# Patient Record
Sex: Female | Born: 1950 | Race: White | Hispanic: No | Smoking: Former smoker
Health system: Southern US, Community
[De-identification: ages and names within clinical notes are randomized; demographics above are authoritative.]

## PROBLEM LIST (undated history)

## (undated) DIAGNOSIS — E739 Lactose intolerance, unspecified: Secondary | ICD-10-CM

## (undated) DIAGNOSIS — L900 Lichen sclerosus et atrophicus: Secondary | ICD-10-CM

## (undated) DIAGNOSIS — F419 Anxiety disorder, unspecified: Secondary | ICD-10-CM

## (undated) DIAGNOSIS — T4145XA Adverse effect of unspecified anesthetic, initial encounter: Secondary | ICD-10-CM

## (undated) DIAGNOSIS — K759 Inflammatory liver disease, unspecified: Secondary | ICD-10-CM

## (undated) DIAGNOSIS — M5136 Other intervertebral disc degeneration, lumbar region: Secondary | ICD-10-CM

## (undated) DIAGNOSIS — E119 Type 2 diabetes mellitus without complications: Secondary | ICD-10-CM

## (undated) DIAGNOSIS — M51369 Other intervertebral disc degeneration, lumbar region without mention of lumbar back pain or lower extremity pain: Secondary | ICD-10-CM

## (undated) DIAGNOSIS — F32A Depression, unspecified: Secondary | ICD-10-CM

## (undated) DIAGNOSIS — F329 Major depressive disorder, single episode, unspecified: Secondary | ICD-10-CM

## (undated) DIAGNOSIS — M5030 Other cervical disc degeneration, unspecified cervical region: Secondary | ICD-10-CM

## (undated) DIAGNOSIS — T8859XA Other complications of anesthesia, initial encounter: Secondary | ICD-10-CM

## (undated) DIAGNOSIS — R569 Unspecified convulsions: Secondary | ICD-10-CM

## (undated) HISTORY — DX: Type 2 diabetes mellitus without complications: E11.9

## (undated) HISTORY — PX: CHOLECYSTECTOMY: SHX55

## (undated) HISTORY — PX: OTHER SURGICAL HISTORY: SHX169

## (undated) HISTORY — DX: Other intervertebral disc degeneration, lumbar region without mention of lumbar back pain or lower extremity pain: M51.369

## (undated) HISTORY — DX: Lichen sclerosus et atrophicus: L90.0

## (undated) HISTORY — PX: SPINE SURGERY: SHX786

## (undated) HISTORY — PX: APPENDECTOMY: SHX54

## (undated) HISTORY — PX: TUBAL LIGATION: SHX77

## (undated) HISTORY — DX: Other intervertebral disc degeneration, lumbar region: M51.36

## (undated) HISTORY — DX: Lactose intolerance, unspecified: E73.9

## (undated) HISTORY — DX: Other cervical disc degeneration, unspecified cervical region: M50.30

---

## 1997-07-12 ENCOUNTER — Other Ambulatory Visit: Admission: RE | Admit: 1997-07-12 | Discharge: 1997-07-12 | Payer: Self-pay | Admitting: *Deleted

## 1999-06-09 ENCOUNTER — Other Ambulatory Visit: Admission: RE | Admit: 1999-06-09 | Discharge: 1999-06-09 | Payer: Self-pay | Admitting: *Deleted

## 2000-07-28 ENCOUNTER — Other Ambulatory Visit: Admission: RE | Admit: 2000-07-28 | Discharge: 2000-07-28 | Payer: Self-pay | Admitting: *Deleted

## 2001-07-25 ENCOUNTER — Other Ambulatory Visit: Admission: RE | Admit: 2001-07-25 | Discharge: 2001-07-25 | Payer: Self-pay | Admitting: *Deleted

## 2002-08-07 ENCOUNTER — Other Ambulatory Visit: Admission: RE | Admit: 2002-08-07 | Discharge: 2002-08-07 | Payer: Self-pay | Admitting: *Deleted

## 2003-06-24 ENCOUNTER — Other Ambulatory Visit: Admission: RE | Admit: 2003-06-24 | Discharge: 2003-06-24 | Payer: Self-pay | Admitting: *Deleted

## 2004-08-17 ENCOUNTER — Other Ambulatory Visit: Admission: RE | Admit: 2004-08-17 | Discharge: 2004-08-17 | Payer: Self-pay | Admitting: *Deleted

## 2005-04-30 ENCOUNTER — Ambulatory Visit: Payer: Self-pay | Admitting: Oncology

## 2005-05-10 ENCOUNTER — Ambulatory Visit: Payer: Self-pay | Admitting: Oncology

## 2007-03-10 ENCOUNTER — Emergency Department (HOSPITAL_COMMUNITY): Admission: EM | Admit: 2007-03-10 | Discharge: 2007-03-10 | Payer: Self-pay | Admitting: Emergency Medicine

## 2007-03-12 ENCOUNTER — Emergency Department (HOSPITAL_COMMUNITY): Admission: EM | Admit: 2007-03-12 | Discharge: 2007-03-12 | Payer: Self-pay | Admitting: Family Medicine

## 2007-05-23 ENCOUNTER — Emergency Department (HOSPITAL_COMMUNITY): Admission: EM | Admit: 2007-05-23 | Discharge: 2007-05-23 | Payer: Self-pay | Admitting: Emergency Medicine

## 2007-09-15 ENCOUNTER — Other Ambulatory Visit: Admission: RE | Admit: 2007-09-15 | Discharge: 2007-09-15 | Payer: Self-pay | Admitting: Gynecology

## 2008-07-09 ENCOUNTER — Ambulatory Visit: Payer: Self-pay | Admitting: Internal Medicine

## 2008-07-09 DIAGNOSIS — Z91018 Allergy to other foods: Secondary | ICD-10-CM

## 2008-07-09 LAB — CONVERTED CEMR LAB
Basophils Relative: 0.1 % (ref 0.0–3.0)
Eosinophils Absolute: 0.1 10*3/uL (ref 0.0–0.7)
Eosinophils Relative: 0.9 % (ref 0.0–5.0)
HCT: 40.8 % (ref 36.0–46.0)
Hemoglobin: 14.5 g/dL (ref 12.0–15.0)
MCHC: 35.7 g/dL (ref 30.0–36.0)
MCV: 94.2 fL (ref 78.0–100.0)
Monocytes Absolute: 0.8 10*3/uL (ref 0.1–1.0)
Neutro Abs: 12.3 10*3/uL — ABNORMAL HIGH (ref 1.4–7.7)
Neutrophils Relative %: 76.7 % (ref 43.0–77.0)
RBC: 4.33 M/uL (ref 3.87–5.11)
WBC: 15.9 10*3/uL — ABNORMAL HIGH (ref 4.5–10.5)

## 2008-07-13 DIAGNOSIS — J309 Allergic rhinitis, unspecified: Secondary | ICD-10-CM | POA: Insufficient documentation

## 2008-07-13 DIAGNOSIS — E119 Type 2 diabetes mellitus without complications: Secondary | ICD-10-CM

## 2008-08-15 ENCOUNTER — Ambulatory Visit: Payer: Self-pay | Admitting: Internal Medicine

## 2008-08-19 ENCOUNTER — Telehealth (INDEPENDENT_AMBULATORY_CARE_PROVIDER_SITE_OTHER): Payer: Self-pay | Admitting: *Deleted

## 2008-12-16 ENCOUNTER — Ambulatory Visit: Payer: Self-pay | Admitting: Internal Medicine

## 2008-12-16 DIAGNOSIS — F17200 Nicotine dependence, unspecified, uncomplicated: Secondary | ICD-10-CM

## 2009-06-19 ENCOUNTER — Ambulatory Visit: Payer: Self-pay | Admitting: Internal Medicine

## 2009-06-19 ENCOUNTER — Telehealth: Payer: Self-pay | Admitting: Internal Medicine

## 2009-08-13 ENCOUNTER — Telehealth: Payer: Self-pay | Admitting: Internal Medicine

## 2009-08-13 ENCOUNTER — Encounter: Payer: Self-pay | Admitting: Internal Medicine

## 2009-08-15 ENCOUNTER — Ambulatory Visit: Payer: Self-pay | Admitting: Internal Medicine

## 2009-08-19 ENCOUNTER — Telehealth (INDEPENDENT_AMBULATORY_CARE_PROVIDER_SITE_OTHER): Payer: Self-pay | Admitting: *Deleted

## 2009-11-07 ENCOUNTER — Ambulatory Visit: Payer: Self-pay | Admitting: Internal Medicine

## 2009-12-03 ENCOUNTER — Telehealth (INDEPENDENT_AMBULATORY_CARE_PROVIDER_SITE_OTHER): Payer: Self-pay | Admitting: *Deleted

## 2010-02-17 NOTE — Progress Notes (Signed)
Summary: letter request/ DDS  Phone Note Call from Patient Call back at Home Phone [phone]   Caller: Patient Call For: Esa Raden Summary of Call: pt requests a letter re: her allergies. this is for DSS purposes- to increase her food/ ebit allowance. i have verified pt's phone # [phone] as well as her home address.  Initial call taken by: Tivis Ringer, CNA,  June 19, 2009 3:03 PM  Follow-up for Phone Call        letter dictated

## 2010-02-17 NOTE — Progress Notes (Signed)
Summary: ALLERGY  Phone Note Call from Patient   Caller: Patient Call For: Dr.Young Details for Reason: Get shots at another office. Summary of Call: Mrs. Rathje called today,she can't afford the gas to come here twice a week. She wants to know if she can get her shots at the [facility] office in [place].( two blocks from her house) Please call her and let her know your decision.[phone]) Initial call taken by: Dimas Millin,  August 19, 2009 3:38 PM  Follow-up for Phone Call        If she can find an office that is willing to give her the allergy shots and assume responsbility then its okay with CDY-just needs documentation from that office.Reynaldo Minium CMA  August 19, 2009 4:52 PM   Additional Follow-up for Phone Call Additional follow up Details #1::        I called Mrs.Arciniega back she said she would call around and let us know what she finds out.  Mrs. Khalid called back yesterday. She has an appt. wk. after next with her primary Dr. then he will refer her to an allergist in [place]. Hopefully they will allow her to get our 1:50000 at their office. Additional Follow-up by: Dimas Millin,  August 19, 2009 4:58 PM

## 2010-02-17 NOTE — Assessment & Plan Note (Signed)
Summary: Wendy House ///KP   Primary Provider/Referring Provider:  Clyda Greener  CC:  Rov and pt states she has been feeling well and doing great!.  History of Present Illness:   December 16, 2008- Allergic rhinitis, ? 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 She says is allergic to aluminum because she got a rash from Secret Deodorant years ago. Now uses unscented. Admits cough sometimes at night. Still smokes. Declines flu vax, saying nutiriton has prevented colds for 20 years. Asks about Pneumovax- discussed.  June 19, 2009- Allergic rhinitis, tobacco, ? food allergy/ intolerance Adjusts her alternative medical therapies- papaya, etc- as needed. Eating a gluten-free vegetarian diet. Trying to cut down smoking, says about 5 cigarettes in past week. Spring pollen season- need Astepro twice daily and uses nasal saline gel. Chest feels tighter/ smothered when outside in humid weather.  Irritant odors make eyes water, throat swell.  Dry cough, rare phlegm. Notes some dyspnea walking outside in morning air. She asks about help getting an air purifier      Preventive Screening-Counseling & Management  Alcohol-Tobacco     Smoking Status: current     Packs/Day: <0.25     Tobacco Counseling: to quit use of tobacco products  Current Medications (verified): 1)  Vistaril 25 Mg Caps (Hydroxyzine Pamoate) .Marland Kitchen.. 1 Three Times A Day 2)  Divalproex Sodium 500 Mg Tbec (Divalproex Sodium) .... Take 1 By Mouth At Bedtime 3)  Super Papaya Plus  Chew (Digestive Enzymes) .... As Needed 4)  Back Ache With Arnica .... As Needed 5)  Astepro 0.15 % Soln (Azelastine Hcl) .Marland Kitchen.. 1-2 Sprays Each Nostril Up To Two Times A Day As Needed 6)  Flexeril .... Take 1 Tablet By Mouth Three Times A Day As Needed (Pt Unsure of Strength) 7)  Ayr Saline Nasal  Gel (Saline) .... As Needed  Allergies (verified): 1)  ! Asa 2)  ! Prednisone 3)  ! Sudafed 4)  ! * Barley 5)   ! Aluminum (Aluminum)  Past History:  Past Medical History: Last updated: 12/16/2008 Diabetes, Type 2 Allergic Rhinitis- Skin test- 08/15/08 lactose intolerance ? food intolerance Degenerative disk disease- cervical and lumbar Vaginal Lichen Slerosus  Family History: Last updated: 08/15/2008 Seasonal allergies arthritis Cander- colon  Social History: Last updated: 06/19/2009 Divorced Patient is a current smoker. 1-2 ppd Disabled by back pain Lives in apartment  Risk Factors: Smoking Status: current (06/19/2009) Packs/Day: <0.25 (06/19/2009)  Past Surgical History: Cholecystectomy Spine surgery Vaginal polypectomy  Social History: Divorced Patient is a current smoker. 1-2 ppd Disabled by back pain Lives in apartment Packs/Day:  <0.25  Review of Systems      See HPI       The patient complains of non-productive cough.  The patient denies shortness of breath with activity, shortness of breath at rest, productive cough, coughing up blood, chest pain, irregular heartbeats, acid heartburn, indigestion, loss of appetite, weight change, abdominal pain, difficulty swallowing, sore throat, tooth/dental problems, headaches, nasal congestion/difficulty breathing through nose, and sneezing.    Vital Signs:  Patient profile:   60 year old female Height:      60 inches Weight:      120 pounds BMI:     23.52 O2 Sat:      97 % on Room air Pulse rate:   96 / minute BP sitting:   130 / 78  (right arm) Cuff size:   regular  Vitals Entered By:  Denna Haggard, CMA (June 19, 2009 1:41 PM)  O2 Sat at Rest %:  97% O2 Flow:  Room air CC: Rov, pt states she has been feeling well and doing great!   Physical Exam  Additional Exam:  General: A/Ox3; pleasant and cooperative, NAD, WDWN, odor of tobacco SKIN: no rash, lesions NODES: no lymphadenopathy HEENT: [place]/AT, EOM- WNL, Conjuctivae- clear, PERRLA, TM-WNL, Nose- clear, Throat- clear, Mallampati  III NECK: Supple w/ fair  ROM, JVD- none, normal carotid impulses w/o bruits Thyroid- CHEST: Clear to P&A HEART: RRR, no m/g/r heard ABDOMEN: Soft and nl;  OZH:YQMV, nl pulses, no edema, left wrist brace "carpal tunnel" NEURO: Grossly intact to observation      Impression & Recommendations:  Problem # 1:  PERSONAL HISTORY OF ALLERGY TO OTHER FOODS (ICD-V15.05) Has to avoid foods she can't tolerate. She maintains an atypical diet.  Problem # 2:  ALLERGIC RHINITIS (ICD-477.9)  Continues Astepro and nasal saline gel She wants to try allergy vaccine which we discussed very carefully. I explained anaphyllaxis and we discussed lack of direct vaccine for foods. She will talk to the billing coordinator. Her updated medication list for this problem includes:    Astepro 0.15 % Soln (Azelastine hcl) .Marland Kitchen... 1-2 sprays each nostril up to two times a day as needed    Ayr Saline Nasal Gel (Saline) .Marland Kitchen... As needed  Problem # 3:  TOBACCO USER (ICD-305.1)  We discussed smoking cessation techniques and motivation to get off completely.  Medications Added to Medication List This Visit: 1)  Ayr Saline Nasal Gel (Saline) .... As needed  Other Orders: Est. Patient Level III (78469) Tobacco use cessation intermediate 3-10 minutes (62952)  Patient Instructions: 1)  Please schedule a follow-up appointment in 1 year. 2)  Talk with the billing clerk to see what allergy shots would cost. If you decide you want to start allergy shots, then please let us know and we will have the allergy lab call you. 3)  Note given for DDS explaining you limit your diet choices due to food intolerance.

## 2010-02-17 NOTE — Progress Notes (Signed)
Summary: Ms. Siers wants allergy shots  Phone Note Call from Patient   Caller: Patient Call For: Dr. Maple Hudson Summary of Call: Patient called and questioned some information about taking alergy shots. I answered questions and patient said to advise Dr. Maple Hudson that she would like to start allergy shots.I advised her I would get the message to Dr. Maple Hudson. Initial call taken by: Clarise Cruz Duncan Dull),  August 13, 2009 9:18 AM    New/Updated Medications: * ALLERGY VACCINE GH NEW START

## 2010-02-17 NOTE — Progress Notes (Signed)
Summary: allergy shots  Phone Note Call from Patient   Caller: [phone] Call For: young Reason for Call: Talk to Nurse Summary of Call: Patient has question about allergy shots? Initial call taken by: Lehman Prom,  December 03, 2009 12:55 PM  Follow-up for Phone Call        will snd to tammy scott to address per cy's request.   Philipp Deputy [facility]  December 03, 2009 1:36 PM   Additional Follow-up for Phone Call Additional follow up Details #1::        I answered pt.'s question. Now she wants [place] to send her allergy records to Dr.Zozlow in [place]. Additional Follow-up by: Dimas Millin,  December 03, 2009 4:21 PM    Additional Follow-up for Phone Call Additional follow up Details #2::    I spoke with patient-she is aware she should have signed a release form at the [place] office for [place] to send our records for cont of care-they will be her allergy dr now for tx of allergies.Reynaldo Minium CMA  December 03, 2009 4:30 PM

## 2010-02-17 NOTE — Assessment & Plan Note (Signed)
Summary: rov//mbw   Primary Provider/Referring Provider:  [facility]  CC:  Follow up visit-allergies.  History of Present Illness: December 16, 2008- Allergic rhinitis, ? 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 She says is allergic to aluminum because she got a rash from Secret Deodorant years ago. Now uses unscented. Admits cough sometimes at night. Still smokes. Declines flu vax, saying nutiriton has prevented colds for 20 years. Asks about Pneumovax- discussed.  June 19, 2009- Allergic rhinitis, tobacco, ? food allergy/ intolerance Adjusts her alternative medical therapies- papaya, etc- as needed. Eating a gluten-free vegetarian diet. Trying to cut down smoking, says about 5 cigarettes in past week. Spring pollen season- need Astepro twice daily and uses nasal saline gel. Chest feels tighter/ smothered when outside in humid weather.  Irritant odors make eyes water, throat swell.  Dry cough, rare phlegm. Notes some dyspnea walking outside in morning air. She asks about help getting an air purifier  November 07, 2009-  Allergic rhinitis, tobacco,  food allergy/ intolerance NurseCC: Follow up visit-allergies She says she moved back to [place] because [place] is too damp and cold, making her ache, and she doesn't hurt in [place].  Dr Kathyrn Lass group said they would be able to gve her allergy shots if we started here. . I suggested she would do better to let us send records there so they can do all treatment for her allergy problems in [place] based on their own assessment and recomendation., which would be easier for her  She tries adding back a food every three months and finds that she can eat regular yogurt with fruit in the bottom, but not yogurt with honey.  Still smoking 1 carton/ month. "Can't afford it", but hasn't stopped. We discussed smokng cessation support again today.  Little blowing, sneezing, coughing or  wheeze.     Preventive Screening-Counseling & Management  Alcohol-Tobacco     Smoking Status: current     Packs/Day: <0.25     Tobacco Counseling: to quit use of tobacco products  Comments: Reinbforced recommendation  Current Medications (verified): 1)  Divalproex Sodium 500 Mg Tbec (Divalproex Sodium) .... Take 1 By Mouth At Bedtime 2)  Super Papaya Plus  Chew (Digestive Enzymes) .... As Needed 3)  Back Ache With Arnica .... As Needed 4)  Astepro 0.15 % Soln (Azelastine Hcl) .Marland Kitchen.. 1-2 Sprays Each Nostril Up To Two Times A Day As Needed 5)  Flexeril .... Take 1 Tablet By Mouth Three Times A Day As Needed (Pt Unsure of Strength) 6)  Ayr Saline Nasal  Gel (Saline) .... As Needed 7)  Allergy Vaccine Gh New Start 8)  Tramadol Hcl 50 Mg Tabs (Tramadol Hcl) .... Take 1 By Mouth Three Times A Day As Needed 9)  Naproxen 500 Mg Tabs (Naproxen) .... Take 1 By Mouth Two Times A Day As Needed 10)  Amitriptyline Hcl 25 Mg Tabs (Amitriptyline Hcl) .... Take 1 By Mouth At Bedtime  Allergies (verified): 1)  ! Asa 2)  ! Prednisone 3)  ! Sudafed 4)  ! * Barley 5)  ! Aluminum (Aluminum)  Past History:  Past Medical History: Last updated: 12/16/2008 Diabetes, Type 2 Allergic Rhinitis- Skin test- 08/15/08 lactose intolerance ? 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 1-2 ppd Disabled by back pain Lives in apartment  Risk Factors: Smoking Status: current (11/07/2009) Packs/Day: <0.25 (11/07/2009)  Review of Systems      See HPI       The patient complains of shortness of breath with activity and nasal congestion/difficulty breathing through nose.  The patient denies  shortness of breath at rest, productive cough, non-productive cough, coughing up blood, chest pain, irregular heartbeats, acid heartburn, indigestion, loss of appetite, weight change, abdominal pain, difficulty swallowing, sore throat, tooth/dental problems, headaches, and sneezing.    Vital Signs:  Patient profile:   60 year old female Height:      60 inches Weight:      121.13 pounds BMI:     23.74 O2 Sat:      98 % on Room air Pulse rate:   95 / minute BP sitting:   116 / 76  (left arm) Cuff size:   regular  Vitals Entered By: Reynaldo Minium CMA (November 07, 2009 3:28 PM)  O2 Flow:  Room air CC: Follow up visit-allergies   Physical Exam  Additional Exam:  General: A/Ox3; pleasant and cooperative, NAD, WDWN,  SKIN: no rash, lesions NODES: no lymphadenopathy HEENT: [place]/AT, EOM- WNL, Conjuctivae- clear, PERRLA, TM-WNL, Nose- clear, Throat- clear, Mallampati  III NECK: Supple w/ fair ROM, JVD- none, normal carotid impulses w/o bruits Thyroid- CHEST: Clear to P&A, no cough or wheeze HEART: RRR, no m/g/r heard ABDOMEN: Soft and nl;  ZOX:WRUE, nl pulses, no edema, left wrist brace "carpal tunnel" NEURO: Grossly intact to observation      Impression & Recommendations:  Problem # 1:  PERSONAL HISTORY OF ALLERGY TO OTHER FOODS (ICD-V15.05) Dr Elnoria Howard did not tell her she had Celiac Disease. She emphasizes her [place] background as a risk factor.  I suggested she continue trying to garadually add back foods as able and keep a food diary. I again explained food intolerance as a better term for her than food allergy  Problem # 2:  ALLERGIC RHINITIS (ICD-477.9)  I am not convinced at all that she needs allergy vaccine. I would prefer that she watch symptoms associated with her new home in [place]. If symptomatic otc meds are insufficient, then it would be much easier on her to get allergy care locally in [place]. i will see her here if she wishes, but that is my recommendation.  Her  updated medication list for this problem includes:    Astepro 0.15 % Soln (Azelastine hcl) .Marland Kitchen... 1-2 sprays each nostril up to two times a day as needed    Ayr Saline Nasal Gel (Saline) .Marland Kitchen... As needed  Problem # 3:  TOBACCO USER (ICD-305.1)  I again emphasized smoking cessation.   Medications Added to Medication List This Visit: 1)  Tramadol Hcl 50 Mg Tabs (Tramadol hcl) .... Take 1 by mouth three times a day as needed 2)  Naproxen 500 Mg Tabs (Naproxen) .... Take 1 by mouth two times a day as needed 3)  Amitriptyline Hcl 25 Mg Tabs (Amitriptyline hcl) .... Take 1 by mouth at bedtime  Other Orders: Est. Patient Level III (45409)  Patient Instructions: 1)  Please schedule a follow-up appointment as needed. 2)  I suggest that it makes more sense for you to work with the doctors in [place] for your allergy and food intolerance problems. Please call them for an appointment and they will contact us for release of records. I will be happy to see  you here in the future if needed.

## 2010-02-17 NOTE — Miscellaneous (Signed)
Summary: New Vaccine/[facility]  New Vaccine/[facility]   Imported By: Sherian Rein 09/18/2009 08:38:05  _____________________________________________________________________  External Attachment:    Type:   Image     Comment:   External Document

## 2010-02-18 ENCOUNTER — Encounter: Payer: Self-pay | Admitting: Cardiovascular Disease

## 2010-02-19 ENCOUNTER — Encounter (INDEPENDENT_AMBULATORY_CARE_PROVIDER_SITE_OTHER): Payer: Medicaid Other

## 2010-02-19 ENCOUNTER — Encounter: Payer: Self-pay | Admitting: Cardiovascular Disease

## 2010-02-19 ENCOUNTER — Ambulatory Visit: Admit: 2010-02-19 | Payer: Self-pay

## 2010-02-19 DIAGNOSIS — I739 Peripheral vascular disease, unspecified: Secondary | ICD-10-CM

## 2010-02-19 DIAGNOSIS — E1159 Type 2 diabetes mellitus with other circulatory complications: Secondary | ICD-10-CM

## 2010-02-25 NOTE — Miscellaneous (Signed)
Summary: Orders Update  Clinical Lists Changes  Orders: Added new Test order of Arterial Duplex Lower Extremity (Arterial Duplex Low) - Signed 

## 2010-06-05 NOTE — Letter (Signed)
July 07, 2009    Shaelee Forni  [address]  [place], [place] [address]   RE:  Cyanne, Delmar [facility]  MRN:  [id number]  /  DOB:  1950/07/10   To whom it may concern,   Ms. Beechy has been seen by me for allergy evaluation with concern of  food allergy.  She is on a self-managed diet, utilizing avoidance of  problem foods, food supplements, avoidance of gluten.  On this regimen,  she feels her symptoms are better controlled.  Unfortunately, this diet  represents additional cost for her and is a hardship.  She asks your  support in assisting her with the cost of her diet in the hope that her  food allowance can be increased.    Sincerely,      Clinton D. Maple Hudson, MD, Tonny Bollman, FACP  Electronically Signed    CDY/MedQ  DD: 07/07/2009  DT: 07/07/2009  Job #: [id number]

## 2010-06-10 ENCOUNTER — Encounter: Payer: Self-pay | Admitting: Internal Medicine

## 2010-06-18 ENCOUNTER — Ambulatory Visit: Payer: Self-pay | Admitting: Internal Medicine

## 2010-06-25 ENCOUNTER — Ambulatory Visit: Payer: Self-pay | Admitting: Internal Medicine

## 2010-07-21 ENCOUNTER — Ambulatory Visit: Payer: Self-pay | Admitting: Internal Medicine

## 2010-10-12 LAB — URINALYSIS, ROUTINE W REFLEX MICROSCOPIC
Glucose, UA: NEGATIVE
Ketones, ur: 15 — AB
Leukocytes, UA: NEGATIVE
pH: 6

## 2010-10-12 LAB — URINE MICROSCOPIC-ADD ON

## 2010-11-30 ENCOUNTER — Other Ambulatory Visit: Payer: Self-pay | Admitting: Neurological Surgery

## 2010-12-15 ENCOUNTER — Inpatient Hospital Stay (HOSPITAL_COMMUNITY): Admission: RE | Admit: 2010-12-15 | Discharge: 2010-12-15 | Payer: Medicaid Other | Source: Ambulatory Visit

## 2010-12-15 NOTE — Pre-Procedure Instructions (Signed)
20 Wendy House  12/15/2010   Your procedure is scheduled on: Dec 21 2010    Report to [facility] at 0730 AM.  Call this number if you have problems the morning of surgery: [phone]   Remember:   Do not eat food:After Midnight.  May have clear liquids: up to 4 Hours before arrival.  Clear liquids include soda, tea, black coffee, apple or grape juice, broth.  Take these medicines the morning of surgery with A SIP OF WATER: *NONE**   Do not wear jewelry, make-up or nail polish.  Do not wear lotions, powders, or perfumes. You may wear deodorant.  Do not shave 48 hours prior to surgery.  Do not bring valuables to the hospital.  Contacts, dentures or bridgework may not be worn into surgery.  Leave suitcase in the car. After surgery it may be brought to your room.  For patients admitted to the hospital, checkout time is 11:00 AM the day of discharge.   Patients discharged the day of surgery will not be allowed to drive home.  Name and phone number of your driver:FAMILY  Special Instructions: Incentive Spirometry - Practice and bring it with you on the day of surgery. and CHG Shower Use Special Wash: 1/2 bottle night before surgery and 1/2 bottle morning of surgery.   Please read over the following fact sheets that you were given: Pain Booklet, Coughing and Deep Breathing, MRSA Information and Surgical Site Infection Prevention NO ASPIRIN OR BLOOD THINNERS

## 2010-12-16 ENCOUNTER — Encounter (HOSPITAL_COMMUNITY)
Admission: RE | Admit: 2010-12-16 | Discharge: 2010-12-16 | Disposition: A | Discharge status: Home / Self Care | Payer: Medicaid Other | Source: Ambulatory Visit | Attending: Neurological Surgery | Admitting: Neurological Surgery

## 2010-12-16 ENCOUNTER — Encounter (HOSPITAL_COMMUNITY): Payer: Self-pay | Admitting: Pharmacy Technician

## 2010-12-16 ENCOUNTER — Encounter (HOSPITAL_COMMUNITY): Payer: Self-pay

## 2010-12-16 HISTORY — DX: Anxiety disorder, unspecified: F41.9

## 2010-12-16 HISTORY — DX: Major depressive disorder, single episode, unspecified: F32.9

## 2010-12-16 HISTORY — DX: Other complications of anesthesia, initial encounter: T88.59XA

## 2010-12-16 HISTORY — DX: Unspecified convulsions: R56.9

## 2010-12-16 HISTORY — DX: Adverse effect of unspecified anesthetic, initial encounter: T41.45XA

## 2010-12-16 HISTORY — DX: Inflammatory liver disease, unspecified: K75.9

## 2010-12-16 HISTORY — DX: Depression, unspecified: F32.A

## 2010-12-16 LAB — BASIC METABOLIC PANEL
CO2: 28 mEq/L (ref 19–32)
Calcium: 9.5 mg/dL (ref 8.4–10.5)
GFR calc Af Amer: 89 mL/min — ABNORMAL LOW (ref >90)
GFR calc non Af Amer: 77 mL/min — ABNORMAL LOW (ref >90)
Glucose, Bld: 96 mg/dL (ref 70–99)
Potassium: 4.4 mEq/L (ref 3.5–5.1)
Sodium: 142 mEq/L (ref 135–145)

## 2010-12-16 LAB — SURGICAL PCR SCREEN
MRSA, PCR: NEGATIVE
Staphylococcus aureus: NEGATIVE

## 2010-12-16 LAB — CBC
Hemoglobin: 13.8 g/dL (ref 12.0–15.0)
RBC: 4.31 MIL/uL (ref 3.87–5.11)

## 2010-12-16 MED ORDER — CEFAZOLIN SODIUM 1-5 GM-% IV SOLN: 1.0000 g | INTRAVENOUS | Status: DC

## 2010-12-16 NOTE — Pre-Procedure Instructions (Signed)
20 Rowen Hur  12/16/2010   Your procedure is scheduled on:  12/21/2010, Monday  Report to [facility] at 7:30 AM.  Call this number if you have problems the morning of surgery: [phone]   Remember:   Do not eat food:After Midnight.  May have clear liquids: up to 4 Hours before arrival.  Clear liquids include soda, tea, black coffee, apple or grape juice, broth.  Take these medicines the morning of surgery with A SIP OF WATER: nothing    Do not wear jewelry, make-up or nail polish.  Do not wear lotions, powders, or perfumes. You may wear deodorant.  Do not shave 48 hours prior to surgery.  Do not bring valuables to the hospital.  Contacts, dentures or bridgework may not be worn into surgery.  Leave suitcase in the car. After surgery it may be brought to your room.  For patients admitted to the hospital, checkout time is 11:00 AM the day of discharge.   Patients discharged the day of surgery will not be allowed to drive home.  Name and phone number of your driver:   Special Instructions: CHG Shower Use Special Wash: 1/2 bottle night before surgery and 1/2 bottle morning of surgery.   Please read over the following fact sheets that you were given: Coughing and Deep Breathing, MRSA Information and Surgical Site Infection Prevention

## 2010-12-21 ENCOUNTER — Encounter (HOSPITAL_COMMUNITY): Payer: Self-pay | Admitting: *Deleted

## 2010-12-21 ENCOUNTER — Ambulatory Visit (HOSPITAL_COMMUNITY): Payer: Medicaid Other | Admitting: Anesthesiology

## 2010-12-21 ENCOUNTER — Encounter (HOSPITAL_COMMUNITY): Payer: Self-pay | Admitting: Anesthesiology

## 2010-12-21 ENCOUNTER — Ambulatory Visit (HOSPITAL_COMMUNITY): Payer: Medicaid Other

## 2010-12-21 ENCOUNTER — Encounter (HOSPITAL_COMMUNITY)
Admission: RE | Disposition: A | Discharge status: Home / Self Care | Payer: Self-pay | Source: Ambulatory Visit | Attending: Neurological Surgery

## 2010-12-21 ENCOUNTER — Inpatient Hospital Stay (HOSPITAL_COMMUNITY)
Admission: RE | Admit: 2010-12-21 | Discharge: 2010-12-21 | DRG: 472 | Disposition: A | Discharge status: Home / Self Care | Payer: Medicaid Other | Source: Ambulatory Visit | Attending: Neurological Surgery | Admitting: Neurological Surgery

## 2010-12-21 DIAGNOSIS — M5000 Cervical disc disorder with myelopathy, unspecified cervical region: Secondary | ICD-10-CM | POA: Diagnosis present

## 2010-12-21 DIAGNOSIS — R569 Unspecified convulsions: Secondary | ICD-10-CM | POA: Diagnosis present

## 2010-12-21 DIAGNOSIS — M4712 Other spondylosis with myelopathy, cervical region: Principal | ICD-10-CM | POA: Diagnosis present

## 2010-12-21 DIAGNOSIS — E119 Type 2 diabetes mellitus without complications: Secondary | ICD-10-CM | POA: Diagnosis present

## 2010-12-21 DIAGNOSIS — Z981 Arthrodesis status: Secondary | ICD-10-CM

## 2010-12-21 DIAGNOSIS — M51379 Other intervertebral disc degeneration, lumbosacral region without mention of lumbar back pain or lower extremity pain: Secondary | ICD-10-CM | POA: Diagnosis present

## 2010-12-21 DIAGNOSIS — K7689 Other specified diseases of liver: Secondary | ICD-10-CM | POA: Diagnosis present

## 2010-12-21 DIAGNOSIS — Z01812 Encounter for preprocedural laboratory examination: Secondary | ICD-10-CM

## 2010-12-21 DIAGNOSIS — M5137 Other intervertebral disc degeneration, lumbosacral region: Secondary | ICD-10-CM | POA: Diagnosis present

## 2010-12-21 DIAGNOSIS — Z79899 Other long term (current) drug therapy: Secondary | ICD-10-CM

## 2010-12-21 DIAGNOSIS — F341 Dysthymic disorder: Secondary | ICD-10-CM | POA: Diagnosis present

## 2010-12-21 DIAGNOSIS — Z87891 Personal history of nicotine dependence: Secondary | ICD-10-CM

## 2010-12-21 HISTORY — DX: Adverse effect of unspecified anesthetic, initial encounter: T41.45XA

## 2010-12-21 HISTORY — PX: ANTERIOR CERVICAL DECOMP/DISCECTOMY FUSION: SHX1161

## 2010-12-21 LAB — GLUCOSE, CAPILLARY

## 2010-12-21 SURGERY — ANTERIOR CERVICAL DECOMPRESSION/DISCECTOMY FUSION 1 LEVEL
Anesthesia: General | Site: Neck | Wound class: Clean

## 2010-12-21 MED ORDER — DIVALPROEX SODIUM ER 500 MG PO TB24
500.0000 mg | ORAL_TABLET | Freq: Every day | ORAL | Status: DC
  Filled 2010-12-21: qty 1

## 2010-12-21 MED ORDER — CEFAZOLIN SODIUM 1-5 GM-% IV SOLN
INTRAVENOUS | Status: DC | PRN
  Administered 2010-12-21: 1 g via INTRAVENOUS

## 2010-12-21 MED ORDER — DIAZEPAM 5 MG PO TABS: 5.0000 mg | ORAL_TABLET | Freq: Four times a day (QID) | ORAL | Status: DC | PRN

## 2010-12-21 MED ORDER — HYDROMORPHONE HCL PF 1 MG/ML IJ SOLN
0.2500 mg | INTRAMUSCULAR | Status: DC | PRN
  Administered 2010-12-21: 0.5 mg via INTRAVENOUS

## 2010-12-21 MED ORDER — OXYCODONE-ACETAMINOPHEN 5-325 MG PO TABS: 1.0000 | ORAL_TABLET | ORAL | Status: AC | PRN

## 2010-12-21 MED ORDER — PROMETHAZINE HCL 25 MG/ML IJ SOLN: 6.2500 mg | INTRAMUSCULAR | Status: DC | PRN

## 2010-12-21 MED ORDER — MORPHINE SULFATE 2 MG/ML IJ SOLN: 0.0500 mg/kg | INTRAMUSCULAR | Status: DC | PRN

## 2010-12-21 MED ORDER — FENTANYL CITRATE 0.05 MG/ML IJ SOLN
INTRAMUSCULAR | Status: DC | PRN
  Administered 2010-12-21: 50 ug via INTRAVENOUS
  Administered 2010-12-21: 150 ug via INTRAVENOUS

## 2010-12-21 MED ORDER — CALCIUM CARBONATE-VITAMIN D 500-200 MG-UNIT PO TABS
2.0000 | ORAL_TABLET | Freq: Every day | ORAL | Status: DC
  Administered 2010-12-21: 2 via ORAL
  Filled 2010-12-21: qty 2

## 2010-12-21 MED ORDER — PROPOFOL 10 MG/ML IV EMUL
INTRAVENOUS | Status: DC | PRN
  Administered 2010-12-21: 150 mg via INTRAVENOUS

## 2010-12-21 MED ORDER — PHENOL 1.4 % MT LIQD: 1.0000 | OROMUCOSAL | Status: DC | PRN

## 2010-12-21 MED ORDER — INSULIN ASPART 100 UNIT/ML ~~LOC~~ SOLN
0.0000 [IU] | Freq: Three times a day (TID) | SUBCUTANEOUS | Status: DC
  Filled 2010-12-21: qty 3

## 2010-12-21 MED ORDER — MEPERIDINE HCL 25 MG/ML IJ SOLN: 6.2500 mg | INTRAMUSCULAR | Status: DC | PRN

## 2010-12-21 MED ORDER — ALUM & MAG HYDROXIDE-SIMETH 400-400-40 MG/5ML PO SUSP
30.0000 mL | Freq: Four times a day (QID) | ORAL | Status: DC | PRN
  Filled 2010-12-21: qty 30

## 2010-12-21 MED ORDER — SODIUM CHLORIDE 0.9 % IJ SOLN: 3.0000 mL | Freq: Two times a day (BID) | INTRAMUSCULAR | Status: DC

## 2010-12-21 MED ORDER — OXYCODONE-ACETAMINOPHEN 5-325 MG PO TABS
1.0000 | ORAL_TABLET | ORAL | Status: DC | PRN
  Administered 2010-12-21: 2 via ORAL
  Filled 2010-12-21: qty 2

## 2010-12-21 MED ORDER — MIDAZOLAM HCL 2 MG/2ML IJ SOLN: 0.5000 mg | Freq: Once | INTRAMUSCULAR | Status: DC | PRN

## 2010-12-21 MED ORDER — ONDANSETRON HCL 4 MG/2ML IJ SOLN: 4.0000 mg | INTRAMUSCULAR | Status: DC | PRN

## 2010-12-21 MED ORDER — BACITRACIN 50000 UNITS IM SOLR
INTRAMUSCULAR | Status: DC | PRN
  Administered 2010-12-21: 12:00:00

## 2010-12-21 MED ORDER — LACTATED RINGERS IV SOLN
INTRAVENOUS | Status: DC | PRN
  Administered 2010-12-21: 11:00:00 via INTRAVENOUS

## 2010-12-21 MED ORDER — NEOSTIGMINE METHYLSULFATE 1 MG/ML IJ SOLN
INTRAMUSCULAR | Status: DC | PRN
  Administered 2010-12-21: 5 mg via INTRAVENOUS

## 2010-12-21 MED ORDER — PHENYLEPHRINE HCL 10 MG/ML IJ SOLN
INTRAMUSCULAR | Status: DC | PRN
  Administered 2010-12-21: 40 ug via INTRAVENOUS
  Administered 2010-12-21: 80 ug via INTRAVENOUS
  Administered 2010-12-21: 40 ug via INTRAVENOUS

## 2010-12-21 MED ORDER — DIAZEPAM 5 MG PO TABS: 5.0000 mg | ORAL_TABLET | Freq: Three times a day (TID) | ORAL | Status: AC | PRN

## 2010-12-21 MED ORDER — AMITRIPTYLINE HCL 25 MG PO TABS
25.0000 mg | ORAL_TABLET | Freq: Two times a day (BID) | ORAL | Status: DC
  Filled 2010-12-21: qty 1

## 2010-12-21 MED ORDER — SODIUM CHLORIDE 0.9 % IV SOLN: 250.0000 mL | INTRAVENOUS | Status: DC

## 2010-12-21 MED ORDER — SODIUM CHLORIDE 0.9 % IJ SOLN: 3.0000 mL | INTRAMUSCULAR | Status: DC | PRN

## 2010-12-21 MED ORDER — HYDROXYZINE PAMOATE 25 MG PO CAPS
25.0000 mg | ORAL_CAPSULE | Freq: Three times a day (TID) | ORAL | Status: DC | PRN
  Filled 2010-12-21: qty 2

## 2010-12-21 MED ORDER — LACTATED RINGERS IV SOLN
INTRAVENOUS | Status: DC | PRN
  Administered 2010-12-21 (×2): via INTRAVENOUS

## 2010-12-21 MED ORDER — SODIUM CHLORIDE 0.9 % IR SOLN
Status: DC | PRN
  Administered 2010-12-21: 1000 mL

## 2010-12-21 MED ORDER — CEFAZOLIN SODIUM 1-5 GM-% IV SOLN
INTRAVENOUS | Status: AC
  Filled 2010-12-21: qty 50

## 2010-12-21 MED ORDER — GLYCOPYRROLATE 0.2 MG/ML IJ SOLN
INTRAMUSCULAR | Status: DC | PRN
  Administered 2010-12-21: .6 mg via INTRAVENOUS

## 2010-12-21 MED ORDER — BUPIVACAINE HCL (PF) 0.25 % IJ SOLN
INTRAMUSCULAR | Status: DC | PRN
  Administered 2010-12-21: 4 mL

## 2010-12-21 MED ORDER — SODIUM CHLORIDE 0.9 % IV SOLN: INTRAVENOUS | Status: DC

## 2010-12-21 MED ORDER — HYDROXYZINE HCL 25 MG PO TABS: 25.0000 mg | ORAL_TABLET | Freq: Three times a day (TID) | ORAL | Status: DC | PRN

## 2010-12-21 MED ORDER — ACETAMINOPHEN 325 MG PO TABS: 650.0000 mg | ORAL_TABLET | ORAL | Status: DC | PRN

## 2010-12-21 MED ORDER — THROMBIN 5000 UNITS EX KIT
PACK | CUTANEOUS | Status: DC | PRN
  Administered 2010-12-21: 5000 [IU] via TOPICAL

## 2010-12-21 MED ORDER — MENTHOL 3 MG MT LOZG: 1.0000 | LOZENGE | OROMUCOSAL | Status: DC | PRN

## 2010-12-21 MED ORDER — FLUTICASONE PROPIONATE 50 MCG/ACT NA SUSP
2.0000 | Freq: Every day | NASAL | Status: DC
  Filled 2010-12-21: qty 16

## 2010-12-21 MED ORDER — ROCURONIUM BROMIDE 100 MG/10ML IV SOLN
INTRAVENOUS | Status: DC | PRN
  Administered 2010-12-21: 40 mg via INTRAVENOUS

## 2010-12-21 MED ORDER — LIDOCAINE-EPINEPHRINE 1 %-1:100000 IJ SOLN
INTRAMUSCULAR | Status: DC | PRN
  Administered 2010-12-21: 4 mL

## 2010-12-21 MED ORDER — DEXAMETHASONE SODIUM PHOSPHATE 4 MG/ML IJ SOLN
INTRAMUSCULAR | Status: DC | PRN
  Administered 2010-12-21: 10 mg via INTRAVENOUS

## 2010-12-21 MED ORDER — ACETAMINOPHEN 650 MG RE SUPP: 650.0000 mg | RECTAL | Status: DC | PRN

## 2010-12-21 MED ORDER — HEMOSTATIC AGENTS (NO CHARGE) OPTIME
TOPICAL | Status: DC | PRN
  Administered 2010-12-21: 1 via TOPICAL

## 2010-12-21 MED ORDER — ONDANSETRON HCL 4 MG/2ML IJ SOLN
INTRAMUSCULAR | Status: DC | PRN
  Administered 2010-12-21: 4 mg via INTRAVENOUS

## 2010-12-21 MED ORDER — CARISOPRODOL 350 MG PO TABS: 350.0000 mg | ORAL_TABLET | Freq: Every day | ORAL | Status: DC

## 2010-12-21 SURGICAL SUPPLY — 64 items
2.3MMDRILL BIT 12MM ×2 IMPLANT
BAG DECANTER FOR FLEXI CONT (MISCELLANEOUS) ×2 IMPLANT
BANDAGE GAUZE ELAST BULKY 4 IN (GAUZE/BANDAGES/DRESSINGS) IMPLANT
BIT DRILL 2.3 12 FIXED (INSTRUMENTS) ×1 IMPLANT
BIT DRILL NEURO 2X3.1 SFT TUCH (MISCELLANEOUS) ×1 IMPLANT
BONE CERVICAL 6MM LRG (Bone Implant) ×2 IMPLANT
BUR BARREL STRAIGHT FLUTE 4.0 (BURR) ×2 IMPLANT
CANISTER SUCTION 2500CC (MISCELLANEOUS) ×2 IMPLANT
CLOTH BEACON ORANGE TIMEOUT ST (SAFETY) ×2 IMPLANT
COLLAR CERV LO CONTOUR FIRM DE (SOFTGOODS) ×2 IMPLANT
CONT SPEC 4OZ CLIKSEAL STRL BL (MISCELLANEOUS) ×4 IMPLANT
DECANTER SPIKE VIAL GLASS SM (MISCELLANEOUS) ×2 IMPLANT
DERMABOND ADVANCED (GAUZE/BANDAGES/DRESSINGS) ×1
DERMABOND ADVANCED .7 DNX12 (GAUZE/BANDAGES/DRESSINGS) ×1 IMPLANT
DRAPE LAPAROTOMY 100X72 PEDS (DRAPES) ×2 IMPLANT
DRAPE MICROSCOPE LEICA (MISCELLANEOUS) IMPLANT
DRAPE POUCH INSTRU U-SHP 10X18 (DRAPES) ×2 IMPLANT
DRESSING TELFA 8X3 (GAUZE/BANDAGES/DRESSINGS) ×2 IMPLANT
DRILL 12MM (INSTRUMENTS) ×2
DRILL NEURO 2X3.1 SOFT TOUCH (MISCELLANEOUS) ×2
DRSG OPSITE 4X5.5 SM (GAUZE/BANDAGES/DRESSINGS) ×2 IMPLANT
DURAPREP 6ML APPLICATOR 50/CS (WOUND CARE) ×2 IMPLANT
ELECT REM PT RETURN 9FT ADLT (ELECTROSURGICAL) ×2
ELECTRODE REM PT RTRN 9FT ADLT (ELECTROSURGICAL) ×1 IMPLANT
GAUZE SPONGE 4X4 16PLY XRAY LF (GAUZE/BANDAGES/DRESSINGS) IMPLANT
GLOVE BIO SURGEON STRL SZ7.5 (GLOVE) IMPLANT
GLOVE BIO SURGEON STRL SZ8.5 (GLOVE) ×2 IMPLANT
GLOVE BIOGEL PI IND STRL 7.0 (GLOVE) ×2 IMPLANT
GLOVE BIOGEL PI IND STRL 7.5 (GLOVE) IMPLANT
GLOVE BIOGEL PI IND STRL 8 (GLOVE) ×1 IMPLANT
GLOVE BIOGEL PI INDICATOR 7.0 (GLOVE) ×2
GLOVE BIOGEL PI INDICATOR 7.5 (GLOVE)
GLOVE BIOGEL PI INDICATOR 8 (GLOVE) ×1
GLOVE ECLIPSE 7.5 STRL STRAW (GLOVE) ×2 IMPLANT
GLOVE ECLIPSE 8.5 STRL (GLOVE) ×2 IMPLANT
GLOVE EXAM NITRILE LRG STRL (GLOVE) IMPLANT
GLOVE EXAM NITRILE MD LF STRL (GLOVE) IMPLANT
GLOVE EXAM NITRILE XL STR (GLOVE) IMPLANT
GLOVE EXAM NITRILE XS STR PU (GLOVE) IMPLANT
GLOVE SS BIOGEL STRL SZ 8 (GLOVE) ×1 IMPLANT
GLOVE SUPERSENSE BIOGEL SZ 8 (GLOVE) ×1
GLOVE SURG SS PI 6.5 STRL IVOR (GLOVE) ×8 IMPLANT
GOWN BRE IMP SLV AUR LG STRL (GOWN DISPOSABLE) ×2 IMPLANT
GOWN BRE IMP SLV AUR XL STRL (GOWN DISPOSABLE) ×6 IMPLANT
GOWN STRL REIN 2XL LVL4 (GOWN DISPOSABLE) ×2 IMPLANT
HEAD HALTER (SOFTGOODS) ×2 IMPLANT
KIT BASIN OR (CUSTOM PROCEDURE TRAY) ×2 IMPLANT
KIT ROOM TURNOVER OR (KITS) ×2 IMPLANT
NEEDLE HYPO 22GX1.5 SAFETY (NEEDLE) ×2 IMPLANT
NEEDLE SPNL 22GX3.5 QUINCKE BK (NEEDLE) ×2 IMPLANT
NS IRRIG 1000ML POUR BTL (IV SOLUTION) ×2 IMPLANT
PACK LAMINECTOMY NEURO (CUSTOM PROCEDURE TRAY) ×2 IMPLANT
PAD ARMBOARD 7.5X6 YLW CONV (MISCELLANEOUS) ×6 IMPLANT
PLATE TRESTLE LUXE 12 1LVL (Plate) ×2 IMPLANT
PUTTY BONE 1CC ×2 IMPLANT
RUBBERBAND STERILE (MISCELLANEOUS) IMPLANT
SCREW 12MM (Screw) ×8 IMPLANT
SPONGE INTESTINAL PEANUT (DISPOSABLE) ×2 IMPLANT
SPONGE SURGIFOAM ABS GEL SZ50 (HEMOSTASIS) ×2 IMPLANT
SUT VIC AB 3-0 SH 8-18 (SUTURE) ×2 IMPLANT
SYR 20ML ECCENTRIC (SYRINGE) ×2 IMPLANT
TOWEL OR 17X24 6PK STRL BLUE (TOWEL DISPOSABLE) ×2 IMPLANT
TOWEL OR 17X26 10 PK STRL BLUE (TOWEL DISPOSABLE) ×2 IMPLANT
WATER STERILE IRR 1000ML POUR (IV SOLUTION) ×2 IMPLANT

## 2010-12-21 NOTE — Op Note (Signed)
Preoperative diagnosis: Cervical spondylosis with radiculopathy and myelopathy Post operative diagnosis: Cervical spondylosis with radiculopathy and myelopathy Procedure: Anterior cervical discectomy decompression of nerve roots and spinal canal C4-5 arthrodesis with structural allograft, Alphatec plate fixation J8-1 Surgeon: Barnett Abu M.D. Asst.: Tressie Stalker M.D. Indications: Patient is a 60 year old individual who was previously had anterior cervical decompression and arthrodesis at C5-6 and C6-C7 she is now developed spondylosis with cord compression at C4-5 she also has pain and weakness in the shoulders on both sides. Procedure: The patient was brought to the operating room placed on the table in supine position. After the smooth induction of general endotracheal anesthesia neck was placed in 5 pounds of halter traction and prepped with alcohol and DuraPrep. After sterile draping and appropriate timeout procedure a transverse incision was created in the left side of the neck and carried down to the platysma. The plane between the sternocleidomastoid and strap muscles dissected bluntly until the prevertebral space was reached. The first identifiable disc space was noted to be C4-5 on a localizing radiograph. The dissection was then undertaken in the longus coli muscle to allow placement of a self-retaining Caspar type retractor.  The anterior longitudinal ligament was opened at C4 and ventral osteophytes were removed with a Leksell rongeur and Kerrison punch. Interspace was cleared of significant quantity of the degenerated disc material ~region of the posterior longitudinal ligament was reached. Dissection was carried out using a high-speed drill and 3-0 Karlin curettes. Uncinate processes were drilled down and removed and osteophytes from the inferior margin of the body of C4 were removed with a Kerrison 2 mm gold punch. After the central canal and lateral recesses were well decompressed the  stasis was achieved with the bipolar cautery and some small pledgets of Gelfoam soaked in thrombin that were later irrigated away.  A 6 mm transgressed was then prepared by enlarging the central cavity and filling with demineralized bone matrix and placing into the interspace.  Next the retractor was removed and a 12 trestle plate was placed over the vertebral bodies and secured with well millimeter variable angle screws. A final localizing radiograph identified the position of the surgical construct. The stasis was achieved in the soft tissues and then the platysma was closed with 3-0 Vicryl in an interrupted fashion and 3-0 Vicryl was used in the subcuticular tissue. Blood loss was estimated at less than 50 cc.

## 2010-12-21 NOTE — Transfer of Care (Signed)
Immediate Anesthesia Transfer of Care Note  Patient: Wendy House  Procedure(s) Performed:2ANTERIOR CERVICAL DECOMPRESSION/DISCECTOMY FUSION 1 LEVEL - Cervical four-five  Anterior cervical decompression/diskectomy fusion  Patient Location: PACU  Anesthesia Type: General  Level of Consciousness: awake, alert  and oriented  Airway & Oxygen Therapy: Patient Spontanous Breathing and Patient connected to nasal cannula oxygen  Post-op Assessment: Report given to PACU RN and Post -op Vital signs reviewed and stable  Post vital signs: Reviewed and stable  Complications: No apparent anesthesia complications

## 2010-12-21 NOTE — Anesthesia Postprocedure Evaluation (Signed)
  Anesthesia Post-op Note  Patient: Wendy House  Procedure(s) Performed:  ANTERIOR CERVICAL DECOMPRESSION/DISCECTOMY FUSION 1 LEVEL - Cervical four-five  Anterior cervical decompression/diskectomy fusion  Patient Location: PACU  Anesthesia Type: General  Level of Consciousness: awake  Airway and Oxygen Therapy: Patient Spontanous Breathing  Post-op Pain: mild  Post-op Assessment: Post-op Vital signs reviewed  Post-op Vital Signs: stable  Complications: No apparent anesthesia complications

## 2010-12-21 NOTE — Plan of Care (Signed)
Problem: Consults Goal: Diagnosis - Spinal Surgery Outcome: Completed/Met Date Met:  12/21/10 Cervical Spine Fusion

## 2010-12-21 NOTE — Anesthesia Preprocedure Evaluation (Signed)
Anesthesia Evaluation  Patient identified by MRN, date of birth, ID band Patient awake    Reviewed: Allergy & Precautions, H&P , NPO status , Patient's Chart, lab work & pertinent test results, reviewed documented beta blocker date and time   Airway Mallampati: II      Dental  (+) Dental Advisory Given   Pulmonary  clear to auscultation        Cardiovascular Regular     Neuro/Psych Seizures -,     GI/Hepatic (+) Hepatitis -  Endo/Other  Diabetes mellitus-  Renal/GU      Musculoskeletal   Abdominal   Peds  Hematology   Anesthesia Other Findings   Reproductive/Obstetrics                           Anesthesia Physical Anesthesia Plan  ASA: III  Anesthesia Plan: General   Post-op Pain Management:    Induction: Intravenous  Airway Management Planned: Oral ETT  Additional Equipment:   Intra-op Plan:   Post-operative Plan:   Informed Consent: I have reviewed the patients History and Physical, chart, labs and discussed the procedure including the risks, benefits and alternatives for the proposed anesthesia with the patient or authorized representative who has indicated his/her understanding and acceptance.   Dental advisory given  Plan Discussed with: CRNA  Anesthesia Plan Comments:         Anesthesia Quick Evaluation
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
## 2010-12-21 NOTE — Preoperative (Signed)
Beta Blockers   Reason not to administer Beta Blockers:Not Applicable 

## 2010-12-22 NOTE — Discharge Summary (Signed)
Physician Discharge Summary  Patient ID: Wendy House MRN: [id number] DOB/AGE: December 21, 1950 60 y.o.  Admit date: 12/21/2010 Discharge date: 12/21/2010  Admission Diagnoses:  Discharge Diagnoses: spondylosis cervical with myelopathy Principal Problem:  *Spondylosis, cervical, with myelopathy   Discharged Condition: good  Hospital Course: admitted for surgery did well no complicating features discharged home 6 hours past procedure  Consults: none  Significant Diagnostic Studies: radiology: MRI: cervical spine  Treatments: surgery: ACDF C4-5  Discharge Exam: Blood pressure 118/81, pulse 111, temperature 97.7 F (36.5 C), temperature source Oral, resp. rate 18, height 4' 11.02" (1.499 m), weight 57.2 kg (126 lb 1.7 oz), SpO2 99.00%. mild deltoid weakness positive spurling test on left  Disposition: Home or Self Care   Discharge Medication List as of 12/21/2010  7:55 PM    START taking these medications   Details  diazepam (VALIUM) 5 MG tablet Take 1 tablet (5 mg total) by mouth every 8 (eight) hours as needed (muscle spasm)., Starting 12/21/2010, Until Thu 12/31/10, Print    oxyCODONE-acetaminophen (PERCOCET) 5-325 MG per tablet Take 1-2 tablets by mouth every 4 (four) hours as needed for pain., Starting 12/21/2010, Until Thu 12/31/10, Print      CONTINUE these medications which have NOT CHANGED   Details  Alfalfa 500 MG TABS Take 2 tablets by mouth daily.  , Until Discontinued, Historical Med    amitriptyline (ELAVIL) 25 MG tablet Take 25 mg by mouth 2 (two) times daily. , Until Discontinued, Historical Med    Ascorbic Acid (VITAMIN C) 1000 MG tablet Take 1,000 mg by mouth daily.  , Until Discontinued, Historical Med    calcium-vitamin D (OSCAL WITH D) 500-200 MG-UNIT per tablet Take 2 tablets by mouth daily.  , Until Discontinued, Historical Med    carisoprodol (SOMA) 350 MG tablet Take 350 mg by mouth at bedtime.  , Until Discontinued, Historical Med    Cinnamon 500 MG TABS  Take 1 tablet by mouth daily.  , Until Discontinued, Historical Med    Digestive Enzymes (SUPER PAPAYA PLUS) CHEW Chew 3-4 tablets by mouth daily as needed. For digestion, Until Discontinued, Historical Med    divalproex (DEPAKOTE) 500 MG 24 hr tablet Take 500 mg by mouth at bedtime. , Until Discontinued, Historical Med    fluticasone (FLONASE) 50 MCG/ACT nasal spray Place 2 sprays into the nose daily.  , Until Discontinued, Historical Med    hydrOXYzine (VISTARIL) 25 MG capsule Take 25-50 mg by mouth 3 (three) times daily as needed. Itching., Until Discontinued, Historical Med    !! OVER THE COUNTER MEDICATION Place 3 tablets under the tongue every 6 (six) hours. ARNICA [place] FOR PAIN , Until Discontinued, Historical Med    !! OVER THE COUNTER MEDICATION Apply 1 application topically daily. BOSWELLIN CREAM..Marland KitchenFOR PAIN , Until Discontinued, Historical Med    Aloe-Sodium Chloride (AYR SALINE NASAL GEL NA) Place 1 spray into the nose daily as needed. To keep nose moist., Until Discontinued, Historical Med     !! - Potential duplicate medications found. Please discuss with provider.     Follow-up Information    Follow up with Julian Askin J. Make an appointment in 4 weeks. (call cynthia for appointment)    Contact information:   [address] [place] [address] [phone]          Signed: Stefani Dama 12/22/2010, 7:03 AM

## 2010-12-24 ENCOUNTER — Encounter (HOSPITAL_COMMUNITY): Payer: Self-pay | Admitting: Neurological Surgery

## 2011-07-15 ENCOUNTER — Telehealth: Payer: Self-pay | Admitting: Internal Medicine

## 2011-07-15 NOTE — Telephone Encounter (Signed)
Pt's last 1:50000 made on  08-20-09. Pt dropped off

## 2012-06-02 IMAGING — CR DG CERVICAL SPINE 2 OR 3 VIEWS
1 series · 1 of 1 positions shown · non-contrast
Comparison: MRI of the cervical spine 11/13/2010 at [facility].

CLINICAL DATA: ACDF C4-5.

CERVICAL SPINE - 2-3 VIEW

[view not recorded]
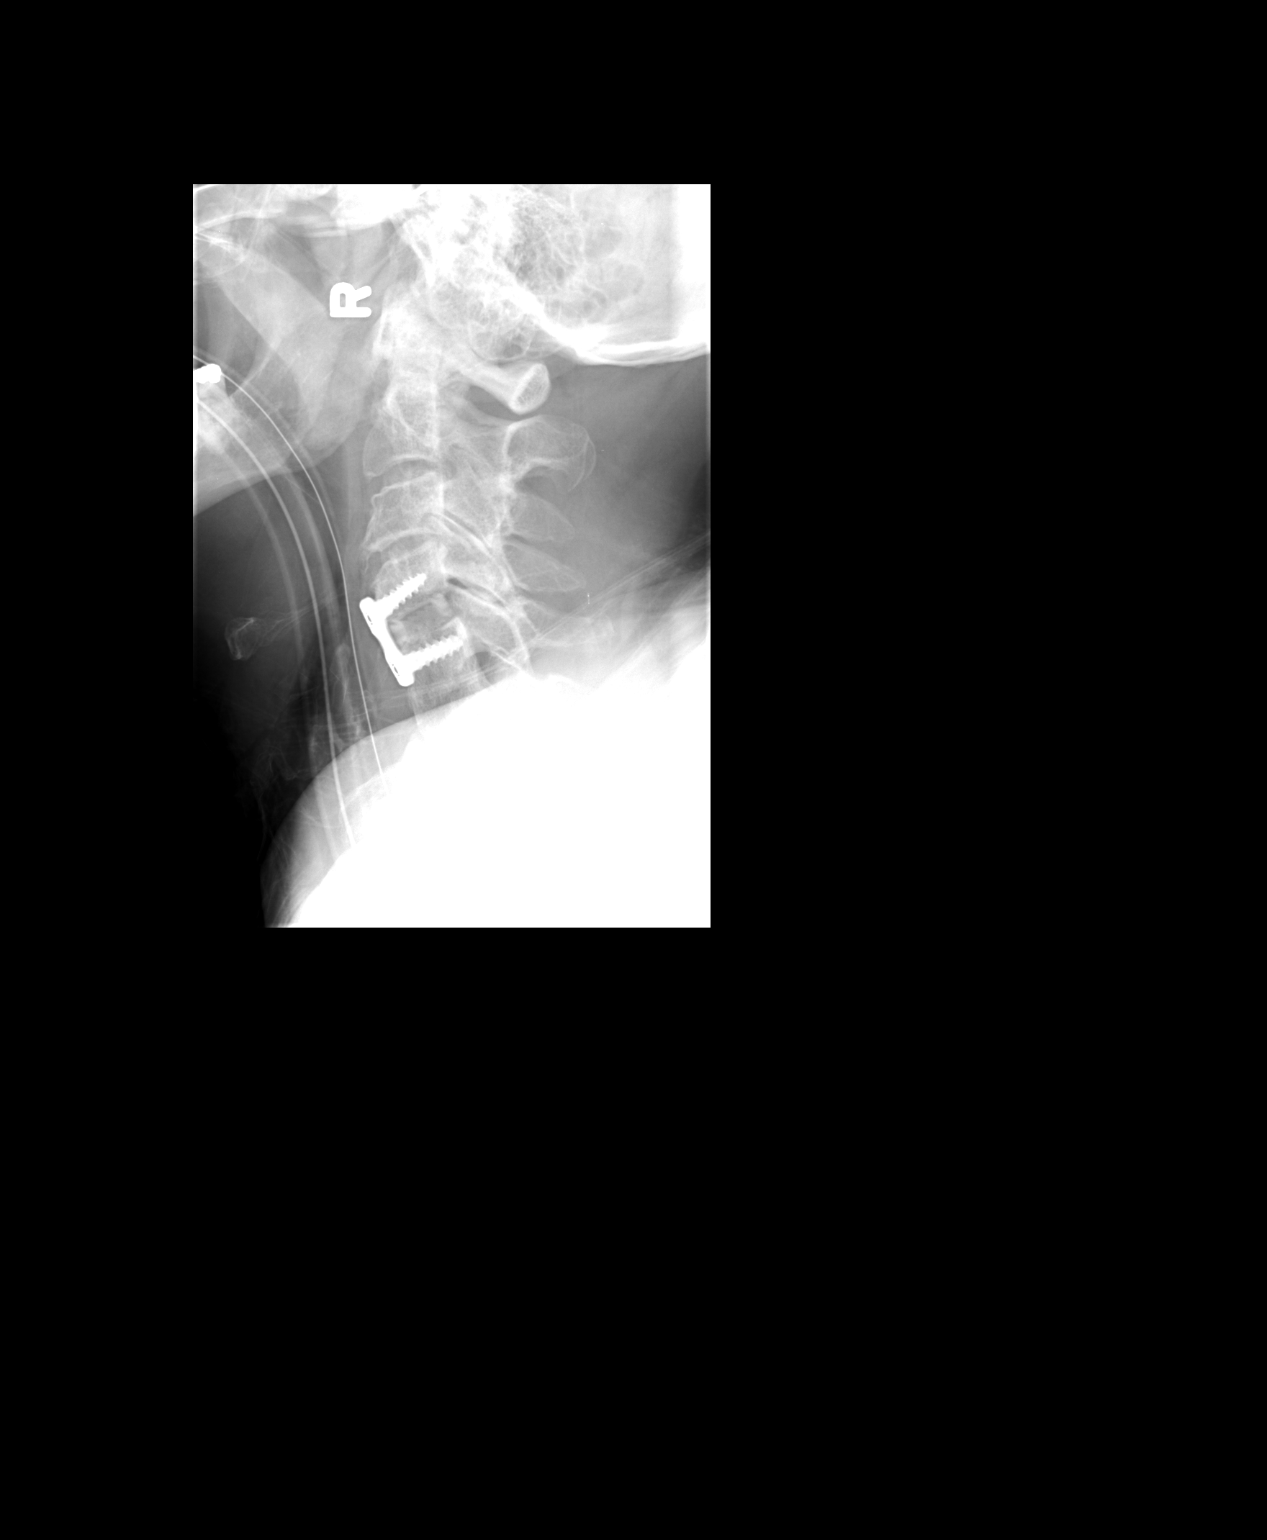

[1 of 1 positions shown; findings below may reference images not displayed]

FINDINGS: Two intraoperative views are submitted. The first image
is labeled [DATE] p.m.  The patient is intubated.  Anterior tissue
spreaders are in place.  The needle is directed at the C4-5 disc
space.

The second image is labeled 1 o'clock p.m.  The patient is now
status post ACDF.  A disc spacer is in place.  Alignment is
anatomic.
IMPRESSION: Status post C4-5 ACDF without radiographic evidence for
complication.

## 2013-01-30 ENCOUNTER — Ambulatory Visit: Payer: Self-pay | Admitting: Podiatrist

## 2013-02-14 ENCOUNTER — Ambulatory Visit (INDEPENDENT_AMBULATORY_CARE_PROVIDER_SITE_OTHER): Payer: Medicaid Other

## 2013-02-14 VITALS — BP 90/60 | HR 89 | Resp 16

## 2013-02-14 DIAGNOSIS — G609 Hereditary and idiopathic neuropathy, unspecified: Secondary | ICD-10-CM

## 2013-02-14 DIAGNOSIS — M722 Plantar fascial fibromatosis: Secondary | ICD-10-CM

## 2013-02-14 DIAGNOSIS — G5760 Lesion of plantar nerve, unspecified lower limb: Secondary | ICD-10-CM

## 2013-02-14 DIAGNOSIS — G629 Polyneuropathy, unspecified: Secondary | ICD-10-CM

## 2013-02-14 DIAGNOSIS — M779 Enthesopathy, unspecified: Secondary | ICD-10-CM

## 2013-02-14 MED ORDER — MELOXICAM 15 MG PO TABS: 15.0000 mg | ORAL_TABLET | Freq: Every day | ORAL | Status: AC
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
## 2013-02-14 NOTE — Patient Instructions (Signed)

## 2013-02-20 ENCOUNTER — Ambulatory Visit: Payer: Self-pay | Admitting: Podiatrist

## 2013-03-14 ENCOUNTER — Ambulatory Visit: Payer: Medicaid Other
